# Patient Record
Sex: Female | Born: 1995 | Race: Black or African American | Hispanic: No | Marital: Single | Smoking: Never smoker
Health system: Southern US, Community
[De-identification: ages and names within clinical notes are randomized; demographics above are authoritative.]

## PROBLEM LIST (undated history)

## (undated) DIAGNOSIS — F419 Anxiety disorder, unspecified: Secondary | ICD-10-CM

## (undated) HISTORY — PX: NO PAST SURGERIES: SHX2092

---
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
## 2019-10-30 ENCOUNTER — Other Ambulatory Visit: Payer: Self-pay

## 2019-10-30 ENCOUNTER — Encounter: Payer: Self-pay | Admitting: Emergency Medicine

## 2019-10-30 ENCOUNTER — Ambulatory Visit
Admission: EM | Admit: 2019-10-30 | Discharge: 2019-10-30 | Disposition: A | Discharge status: Home / Self Care | Payer: BC Managed Care – PPO | Attending: Emergency Medicine | Admitting: Emergency Medicine

## 2019-10-30 DIAGNOSIS — T7840XA Allergy, unspecified, initial encounter: Secondary | ICD-10-CM | POA: Diagnosis not present

## 2019-10-30 HISTORY — DX: Anxiety disorder, unspecified: F41.9

## 2019-10-30 MED ORDER — FLUTICASONE PROPIONATE 50 MCG/ACT NA SUSP: 2.0000 | Freq: Every day | NASAL | 2 refills | Status: AC

## 2019-10-30 MED ORDER — LEVOCETIRIZINE DIHYDROCHLORIDE 5 MG PO TABS: 5.0000 mg | ORAL_TABLET | Freq: Every evening | ORAL | 1 refills | Status: AC

## 2019-10-30 NOTE — ED Triage Notes (Signed)
Pt c/o chest pain, shortness of breath, post nasal drainage. Started about a week ago. She states that the chest pain has been going for a "while" and has not changed. She states that she has to clear her throat a lot and she gets up yellow/green mucus. Denies cough, fever or other URI symptoms.

## 2019-10-30 NOTE — Discharge Instructions (Addendum)
You are seen today with complaints shortness of breath and postnasal drip, diagnosed with allergies.  Take your new prescribed medications as prescribed.  If your symptoms do not subside, it may be beneficial for you to follow-up with an allergist.

## 2019-10-30 NOTE — ED Provider Notes (Signed)
[facility] [place] [facility] [place]   Name: Teresa Porter DOB: 12-26-1995 MRN: [id number] CSN: [id number] PCP: System, Pcp Not In  Arrival date and time:  10/30/19 1141  Chief Complaint:  Chest Pain and Shortness of Breath   NOTE: Prior to seeing the patient today, I have reviewed the triage nursing documentation and vital signs. Clinical staff has updated patient's PMH/PSHx, current medication list, and drug allergies/intolerances to ensure comprehensive history available to assist in medical decision making.   History:   HPI: Teresa Porter is a 24 y.o. female who presents today with complaints of shortness of breath cough chest pain cough and postnasal drainage.  She states chest pain started 4 months ago and was attributed to her anxiety, however the remaining symptoms started about 1 week ago.  She states the postnasal drainage and shortness of breath worsens when she is lying flat and she feels as though she is continuously clearing her throat.  She takes OTC cetirizine for her allergies, but she has noticed little to no change in her condition.  She states she also has mild asthma and eczema.   Past Medical History:  Diagnosis Date  . Anxiety     Past Surgical History:  Procedure Laterality Date  . NO PAST SURGERIES      Family History  Problem Relation Age of Onset  . Healthy Mother   . Healthy Father     Social History   Tobacco Use  . Smoking status: Never Smoker  . Smokeless tobacco: Never Used  Substance Use Topics  . Alcohol use: Not Currently  . Drug use: Not Currently    There are no problems to display for this patient.   Home Medications:    Current Meds  Medication Sig  . escitalopram (LEXAPRO) 5 MG tablet Take by mouth.    Allergies:   Patient has no known allergies.  Review of Systems (ROS): Review of Systems  Constitutional: Negative for activity change, appetite change, diaphoresis, fatigue and fever.  HENT: Positive for  congestion, postnasal drip, sinus pain and sore throat. Negative for ear discharge, ear pain, facial swelling, hearing loss, sinus pressure and sneezing.   Eyes: Negative for pain.  Respiratory: Positive for cough. Negative for shortness of breath and wheezing.   Gastrointestinal: Negative for constipation, nausea and vomiting.  Musculoskeletal: Negative for myalgias.  All other systems reviewed and are negative.    Vital Signs: Today's Vitals   10/30/19 1216 10/30/19 1220 10/30/19 1240  BP:  132/82   Pulse:  84   Resp:  18   Temp:  98.4 F (36.9 C)   TempSrc:  Oral   SpO2:  100%   Weight: 220 lb (99.8 kg)    Height: 5\' 10"  (1.778 m)    PainSc: 0-No pain  0-No pain    Physical Exam: Physical Exam Vitals and nursing note reviewed.  Constitutional:      Appearance: Normal appearance.  HENT:     Mouth/Throat:     Pharynx: No posterior oropharyngeal erythema.  Cardiovascular:     Rate and Rhythm: Normal rate and regular rhythm.     Pulses: Normal pulses.     Heart sounds: Normal heart sounds.  Pulmonary:     Effort: Pulmonary effort is normal. No respiratory distress.     Breath sounds: Normal breath sounds.  Skin:    General: Skin is warm and dry.  Neurological:     General: No focal deficit present.  Mental Status: She is alert and oriented to person, place, and time.  Psychiatric:        Mood and Affect: Mood normal.        Behavior: Behavior normal.      Urgent Care Treatments / Results:   LABS: PLEASE NOTE: all labs that were ordered this encounter are listed, however only abnormal results are displayed. Labs Reviewed - No data to display  EKG: -None  RADIOLOGY: No results found.  PROCEDURES: Procedures  MEDICATIONS RECEIVED THIS VISIT: Medications - No data to display  PERTINENT CLINICAL COURSE NOTES/UPDATES:   Initial Impression / Assessment and Plan / Urgent Care Course:  Pertinent labs & imaging results that were available during my care  of the patient were personally reviewed by me and considered in my medical decision making (see lab/imaging section of note for values and interpretations).  Teresa Porter is a 24 y.o. female who presents to [facility] today with complaints of postnasal drainage and shortness of breath, diagnosed with allergies, and treated as such with the medications below. NP and patient reviewed discharge instructions below during visit.   Patient is well appearing overall in clinic today. She does not appear to be in any acute distress. Presenting symptoms (see HPI) and exam as documented above.   I have reviewed the follow up and strict return precautions for any new or worsening symptoms. Patient is aware of symptoms that would be deemed urgent/emergent, and would thus require further evaluation either here or in the emergency department. At the time of discharge, she verbalized understanding and consent with the discharge plan as it was reviewed with her. All questions were fielded by provider and/or clinic staff prior to patient discharge.    Final Clinical Impressions / Urgent Care Diagnoses:   Final diagnoses:  Allergy, initial encounter    New Prescriptions:  [place] Controlled Substance Registry consulted? Not Applicable  Meds ordered this encounter  Medications  . levocetirizine (XYZAL) 5 MG tablet    Sig: Take 1 tablet (5 mg total) by mouth every evening.    Dispense:  30 tablet    Refill:  1  . fluticasone (FLONASE) 50 MCG/ACT nasal spray    Sig: Place 2 sprays into both nostrils daily.    Dispense:  18.2 mL    Refill:  2      Discharge Instructions     You are seen today with complaints shortness of breath and postnasal drip, diagnosed with allergies.  Take your new prescribed medications as prescribed.  If your symptoms do not subside, it may be beneficial for you to follow-up with an allergist.    Recommended Follow up Care:  Patient encouraged to follow up with the  following provider within the specified time frame, or sooner as dictated by the severity of her symptoms. As always, she was instructed that for any urgent/emergent care needs, she should seek care either here or in the emergency department for more immediate evaluation.   Gertie Baron, DNP, NP-c    Gertie Baron, NP 10/30/19 1300

## 2020-09-04 ENCOUNTER — Ambulatory Visit
Admission: EM | Admit: 2020-09-04 | Discharge: 2020-09-04 | Disposition: A | Discharge status: Home / Self Care | Payer: BC Managed Care – PPO | Attending: Sports Medicine | Admitting: Sports Medicine

## 2020-09-04 ENCOUNTER — Other Ambulatory Visit: Payer: Self-pay

## 2020-09-04 DIAGNOSIS — Z20822 Contact with and (suspected) exposure to covid-19: Secondary | ICD-10-CM | POA: Diagnosis not present

## 2020-09-04 DIAGNOSIS — J029 Acute pharyngitis, unspecified: Secondary | ICD-10-CM | POA: Diagnosis present

## 2020-09-04 LAB — RESP PANEL BY RT-PCR (FLU A&B, COVID) ARPGX2
Influenza A by PCR: NEGATIVE
Influenza B by PCR: NEGATIVE
SARS Coronavirus 2 by RT PCR: NEGATIVE

## 2020-09-04 LAB — GROUP A STREP BY PCR: Group A Strep by PCR: NOT DETECTED

## 2020-09-04 NOTE — ED Provider Notes (Signed)
[facility]    CSN: [id number] Arrival date & time: 09/04/20  1042      History   Chief Complaint Chief Complaint  Patient presents with  . Sore Throat    HPI Teresa HOEFER is a 24 y.o. female.   24 yo female presents with 1 day of sore throat, congestion and rhinorrhea.  No fevers, shakes, chills, GI or GU symptoms.     Past Medical History:  Diagnosis Date  . Anxiety     There are no problems to display for this patient.   Past Surgical History:  Procedure Laterality Date  . NO PAST SURGERIES      OB History   No obstetric history on file.      Home Medications    Prior to Admission medications   Medication Sig Start Date End Date Taking? Authorizing Provider  fluticasone (FLONASE) 50 MCG/ACT nasal spray Place 2 sprays into both nostrils daily. 10/30/19  Yes Bailey Mech, NP  levocetirizine (XYZAL) 5 MG tablet Take 1 tablet (5 mg total) by mouth every evening. 10/30/19  Yes Bailey Mech, NP  sertraline (ZOLOFT) 25 MG tablet Take by mouth. 08/26/20 08/26/21 Yes [provider]  escitalopram (LEXAPRO) 5 MG tablet Take by mouth. 10/11/19 01/09/20  [provider]    Family History Family History  Problem Relation Age of Onset  . Healthy Mother   . Healthy Father     Social History Social History   Tobacco Use  . Smoking status: Never Smoker  . Smokeless tobacco: Never Used  Vaping Use  . Vaping Use: Never used  Substance Use Topics  . Alcohol use: Not Currently  . Drug use: Not Currently     Allergies   Patient has no known allergies.   Review of Systems Review of Systems  Constitutional: Negative for activity change, appetite change, chills and fever.  HENT: Positive for congestion, rhinorrhea, sinus pressure and sore throat. Negative for ear pain, facial swelling, trouble swallowing and voice change.   Respiratory: Negative for cough, chest tightness, shortness of breath and wheezing.    Cardiovascular: Negative for chest pain.  Gastrointestinal: Negative for abdominal pain, blood in stool, diarrhea, nausea and vomiting.  Genitourinary: Negative for difficulty urinating, dysuria and flank pain.  Musculoskeletal: Negative for arthralgias.     Physical Exam Triage Vital Signs ED Triage Vitals  Enc Vitals Group     BP 09/04/20 1059 119/71     Pulse Rate 09/04/20 1059 85     Resp 09/04/20 1059 18     Temp 09/04/20 1059 98.7 F (37.1 C)     Temp Source 09/04/20 1059 Oral     SpO2 09/04/20 1059 100 %     Weight 09/04/20 1056 220 lb (99.8 kg)     Height 09/04/20 1056 5\' 10"  (1.778 m)     Head Circumference --      Peak Flow --      Pain Score 09/04/20 1056 4     Pain Loc --      Pain Edu? --      Excl. in GC? --    No data found.  Updated Vital Signs BP 119/71 (BP Location: Right Arm)   Pulse 85   Temp 98.7 F (37.1 C) (Oral)   Resp 18   Ht 5\' 10"  (1.778 m)   Wt 99.8 kg   LMP 08/28/2020   SpO2 100%   BMI 31.57 kg/m   Visual Acuity Right Eye Distance:  Left Eye Distance:   Bilateral Distance:    Right Eye Near:   Left Eye Near:    Bilateral Near:     Physical Exam Vitals reviewed.  Constitutional:      Appearance: She is well-developed.  HENT:     Head: Normocephalic.     Right Ear: Tympanic membrane normal.     Left Ear: Tympanic membrane normal.     Nose: Congestion present.     Mouth/Throat:     Mouth: Mucous membranes are moist.     Pharynx: Oropharynx is clear. Uvula midline.     Tonsils: No tonsillar exudate or tonsillar abscesses. 1+ on the right. 1+ on the left.  Eyes:     Conjunctiva/sclera: Conjunctivae normal.  Cardiovascular:     Rate and Rhythm: Regular rhythm.     Heart sounds: Normal heart sounds. No murmur heard. No friction rub. No gallop.   Pulmonary:     Effort: Pulmonary effort is normal. No respiratory distress.     Breath sounds: Normal breath sounds. No stridor. No wheezing, rhonchi or rales.  Musculoskeletal:      Cervical back: Normal range of motion and neck supple.  Lymphadenopathy:     Cervical: No cervical adenopathy.  Skin:    General: Skin is warm and dry.     Capillary Refill: Capillary refill takes less than 2 seconds.  Neurological:     Mental Status: She is alert.      UC Treatments / Results  Labs (all labs ordered are listed, but only abnormal results are displayed) Labs Reviewed  RESP PANEL BY RT-PCR (FLU A&B, COVID) ARPGX2  GROUP A STREP BY PCR    EKG   Radiology No results found.  Procedures Procedures (including critical care time)  Medications Ordered in UC Medications - No data to display  Initial Impression / Assessment and Plan / UC Course  I have reviewed the triage vital signs and the nursing notes.  Pertinent labs & imaging results that were available during my care of the patient were reviewed by me and considered in my medical decision making (see chart for details).     Labs were negative today.  Follow up here, with PCP, or ER if symptoms persist or worsen. Final Clinical Impressions(s) / UC Diagnoses   Final diagnoses:  None   Discharge Instructions   None    ED Prescriptions    None     PDMP not reviewed this encounter.   Delton See, MD 09/04/20 1204

## 2020-09-04 NOTE — Discharge Instructions (Addendum)
Labs were negative today.  Follow up here, with PCP, or ER if symptoms persist or worsen.

## 2020-09-04 NOTE — ED Triage Notes (Signed)
Patient states that she has been having a sore throat and nasal congestion x yesterday. Patient states that she has been taking Theraflu without relief.

## 2022-01-05 ENCOUNTER — Ambulatory Visit
Admission: EM | Admit: 2022-01-05 | Discharge: 2022-01-05 | Disposition: A | Discharge status: Home / Self Care | Payer: 59 | Attending: Physician Assistant | Admitting: Physician Assistant

## 2022-01-05 DIAGNOSIS — J309 Allergic rhinitis, unspecified: Secondary | ICD-10-CM | POA: Insufficient documentation

## 2022-01-05 DIAGNOSIS — J029 Acute pharyngitis, unspecified: Secondary | ICD-10-CM | POA: Insufficient documentation

## 2022-01-05 LAB — GROUP A STREP BY PCR: Group A Strep by PCR: NOT DETECTED

## 2022-01-05 MED ORDER — LIDOCAINE VISCOUS HCL 2 % MT SOLN: 15.0000 mL | OROMUCOSAL | 0 refills | Status: AC | PRN

## 2022-01-05 NOTE — Discharge Instructions (Signed)
-  Strep test negative.  Continue allergy medication.  Make sure you are taking an antihistamine if you are not already.  Consider taking Allegra-D which you get behind the counter.  Can also use nasal saline and make sure to increase rest and fluids.  I have sent viscous lidocaine to pharmacy. ?

## 2022-01-05 NOTE — ED Provider Notes (Signed)
?[facility] ? ? ? ?CSN: [id number] ?Arrival date & time: 01/05/22  1818 ? ? ?  ? ?History   ?Chief Complaint ?Chief Complaint  ?Patient presents with  ? Sore Throat  ? ? ?HPI ?Teresa Porter is a 26 y.o. female presenting for 1 week history of sore throat, painful swallowing, cough, congestion and postnasal drainage.  Denies fever, fatigue, with difficulty, nausea/vomiting/diarrhea.  Denies sick contacts.  History of allergies.  Takes Singulair and uses Flonase.  Patient is also been taking Mucinex and says it helps a little but the sore throat seems to be a little worse now and she wants to be checked for strep throat.  She is otherwise healthy.  No other complaints. ? ?HPI ? ?Past Medical History:  ?Diagnosis Date  ? Anxiety   ? ? ?There are no problems to display for this patient. ? ? ?Past Surgical History:  ?Procedure Laterality Date  ? NO PAST SURGERIES    ? ? ?OB History   ?No obstetric history on file. ?  ? ? ? ?Home Medications   ? ?Prior to Admission medications   ?Medication Sig Start Date End Date Taking? Authorizing Provider  ?fluticasone (FLONASE) 50 MCG/ACT nasal spray Place 2 sprays into both nostrils daily. 10/30/19  Yes Bailey Mech, NP  ?levocetirizine (XYZAL) 5 MG tablet Take 1 tablet (5 mg total) by mouth every evening. 10/30/19  Yes Bailey Mech, NP  ?lidocaine (XYLOCAINE) 2 % solution Use as directed 15 mLs in the mouth or throat every 3 (three) hours as needed for mouth pain (swish and spit). 01/05/22  Yes Eusebio Friendly B, PA-C  ?escitalopram (LEXAPRO) 5 MG tablet Take by mouth. 10/11/19 01/09/20  [provider]  ?sertraline (ZOLOFT) 25 MG tablet Take by mouth. 08/26/20 08/26/21  [provider]  ? ? ?Family History ?Family History  ?Problem Relation Age of Onset  ? Healthy Mother   ? Healthy Father   ? ? ?Social History ?Social History  ? ?Tobacco Use  ? Smoking status: Never  ? Smokeless tobacco: Never  ?Vaping Use  ? Vaping Use: Never used  ?Substance Use  Topics  ? Alcohol use: Not Currently  ? Drug use: Not Currently  ? ? ? ?Allergies   ?Patient has no known allergies. ? ? ?Review of Systems ?Review of Systems  ?Constitutional:  Negative for chills, diaphoresis, fatigue and fever.  ?HENT:  Positive for congestion, postnasal drip, rhinorrhea and sore throat. Negative for ear pain, sinus pressure and sinus pain.   ?Respiratory:  Positive for cough. Negative for shortness of breath.   ?Gastrointestinal:  Negative for abdominal pain, nausea and vomiting.  ?Musculoskeletal:  Negative for arthralgias and myalgias.  ?Skin:  Negative for rash.  ?Neurological:  Negative for weakness and headaches.  ?Hematological:  Negative for adenopathy.  ? ? ?Physical Exam ?Triage Vital Signs ?ED Triage Vitals  ?Enc Vitals Group  ?   BP 01/05/22 1842 139/81  ?   Pulse Rate 01/05/22 1842 76  ?   Resp 01/05/22 1842 18  ?   Temp 01/05/22 1842 98.8 ?F (37.1 ?C)  ?   Temp Source 01/05/22 1842 Oral  ?   SpO2 01/05/22 1842 100 %  ?   Weight 01/05/22 1841 220 lb (99.8 kg)  ?   Height 01/05/22 1841 5\' 10"  (1.778 m)  ?   Head Circumference --   ?   Peak Flow --   ?   Pain Score 01/05/22 1841 7  ?  Pain Loc --   ?   Pain Edu? --   ?   Excl. in GC? --   ? ?No data found. ? ?Updated Vital Signs ?BP 139/81 (BP Location: Left Arm)   Pulse 76   Temp 98.8 ?F (37.1 ?C) (Oral)   Resp 18   Ht 5\' 10"  (1.778 m)   Wt 220 lb (99.8 kg)   LMP 01/05/2022   SpO2 100%   BMI 31.57 kg/m?  ?  ? ?Physical Exam ?Vitals and nursing note reviewed.  ?Constitutional:   ?   General: She is not in acute distress. ?   Appearance: Normal appearance. She is not ill-appearing or toxic-appearing.  ?HENT:  ?   Head: Normocephalic and atraumatic.  ?   Right Ear: Tympanic membrane, ear canal and external ear normal.  ?   Left Ear: Tympanic membrane, ear canal and external ear normal.  ?   Nose: Congestion present.  ?   Mouth/Throat:  ?   Mouth: Mucous membranes are moist.  ?   Pharynx: Oropharynx is clear. Posterior  oropharyngeal erythema present.  ?Eyes:  ?   General: No scleral icterus.    ?   Right eye: No discharge.     ?   Left eye: No discharge.  ?   Conjunctiva/sclera: Conjunctivae normal.  ?Cardiovascular:  ?   Rate and Rhythm: Normal rate and regular rhythm.  ?   Heart sounds: Normal heart sounds.  ?Pulmonary:  ?   Effort: Pulmonary effort is normal. No respiratory distress.  ?   Breath sounds: Normal breath sounds.  ?Musculoskeletal:  ?   Cervical back: Neck supple.  ?Lymphadenopathy:  ?   Cervical: No cervical adenopathy.  ?Skin: ?   General: Skin is dry.  ?Neurological:  ?   General: No focal deficit present.  ?   Mental Status: She is alert. Mental status is at baseline.  ?   Motor: No weakness.  ?   Gait: Gait normal.  ?Psychiatric:     ?   Mood and Affect: Mood normal.     ?   Behavior: Behavior normal.     ?   Thought Content: Thought content normal.  ? ? ? ?UC Treatments / Results  ?Labs ?(all labs ordered are listed, but only abnormal results are displayed) ?Labs Reviewed  ?GROUP A STREP BY PCR  ? ? ?EKG ? ? ?Radiology ?No results found. ? ?Procedures ?Procedures (including critical care time) ? ?Medications Ordered in UC ?Medications - No data to display ? ?Initial Impression / Assessment and Plan / UC Course  ?I have reviewed the triage vital signs and the nursing notes. ? ?Pertinent labs & imaging results that were available during my care of the patient were reviewed by me and considered in my medical decision making (see chart for details). ? ?26 year old female presenting for sore throat, painful swallowing, postnasal drainage, nasal congestion/runny nose and cough for the past 1 week.  Sore throat seems to be getting little worse despite taking Singulair, Flonase and Mucinex.  Vitals normal and stable and patient overall well-appearing.  On exam she does have nasal congestion without drainage, erythema posterior pharynx with clearish postnasal drainage.  Chest clear to auscultation heart regular rate  and rhythm.  PCR strep test performed. ? ?Negative strep.  Symptoms likely due to allergies and patient's not taking antihistamine.  Advised her to start Allegra-D and continue with the Singulair and Flonase.  Sent viscous lidocaine.  Advised throat lozenges and Chloraseptic  spray as well.  Increase rest and fluids and follow-up as needed. ? ?Final Clinical Impressions(s) / UC Diagnoses  ? ?Final diagnoses:  ?Sore throat  ?Allergic rhinitis, unspecified seasonality, unspecified trigger  ? ? ? ?Discharge Instructions   ? ?  ?-Strep test negative.  Continue allergy medication.  Make sure you are taking an antihistamine if you are not already.  Consider taking Allegra-D which you get behind the counter.  Can also use nasal saline and make sure to increase rest and fluids.  I have sent viscous lidocaine to pharmacy. ? ? ? ? ?ED Prescriptions   ? ? Medication Sig Dispense Auth. Provider  ? lidocaine (XYLOCAINE) 2 % solution Use as directed 15 mLs in the mouth or throat every 3 (three) hours as needed for mouth pain (swish and spit). 100 mL Shirlee LatchEaves, Marca Gadsby B, PA-C  ? ?  ? ?PDMP not reviewed this encounter. ?  ?Shirlee Latchaves, Evangelyne Loja B, PA-C ?01/05/22 1920 ? ?

## 2022-01-05 NOTE — ED Triage Notes (Signed)
Pt c/o possible strep throat. Pt states that it hurts to swallow and has been sore for about a week.  ? ?Pt asks for a strep test to be done.  ?

## 2022-09-29 ENCOUNTER — Ambulatory Visit
Admission: EM | Admit: 2022-09-29 | Discharge: 2022-09-29 | Disposition: A | Discharge status: Home / Self Care | Payer: 59

## 2022-09-29 ENCOUNTER — Other Ambulatory Visit: Payer: Self-pay

## 2022-09-29 DIAGNOSIS — J069 Acute upper respiratory infection, unspecified: Secondary | ICD-10-CM | POA: Diagnosis not present

## 2022-09-29 MED ORDER — IPRATROPIUM BROMIDE 0.06 % NA SOLN: 2.0000 | Freq: Four times a day (QID) | NASAL | 12 refills | Status: AC

## 2022-09-29 MED ORDER — BENZONATATE 100 MG PO CAPS: 200.0000 mg | ORAL_CAPSULE | Freq: Three times a day (TID) | ORAL | 0 refills | Status: AC

## 2022-09-29 MED ORDER — PROMETHAZINE-DM 6.25-15 MG/5ML PO SYRP: 5.0000 mL | ORAL_SOLUTION | Freq: Four times a day (QID) | ORAL | 0 refills | Status: AC | PRN

## 2022-09-29 NOTE — ED Provider Notes (Signed)
[facility]    CSN: [id number] Arrival date & time: 09/29/22  1306      History   Chief Complaint Chief Complaint  Patient presents with   Generalized Body Aches   Nasal Congestion   Sore Throat    HPI Teresa Porter is a 27 y.o. female.   HPI  27 year old female here for evaluation of respiratory complaints.  The patient reports that her symptoms began 4 days ago and they consist of a subjective fever, runny nose, nasal congestion, sneezing, ear pain, sore throat, cough that is productive for yellow sputum, and body aches.  She denies any shortness of breath, wheezing, GI complaints, or recent travel.  She states that her mom had influenza over Christmas.  Past Medical History:  Diagnosis Date   Anxiety     There are no problems to display for this patient.   Past Surgical History:  Procedure Laterality Date   NO PAST SURGERIES      OB History   No obstetric history on file.      Home Medications    Prior to Admission medications   Medication Sig Start Date End Date Taking? Authorizing Provider  albuterol (VENTOLIN HFA) 108 (90 Base) MCG/ACT inhaler Inhale 2 puffs into the lungs every 6 (six) hours as needed. 07/22/19  Yes [provider]  benzonatate (TESSALON) 100 MG capsule Take 2 capsules (200 mg total) by mouth every 8 (eight) hours. 09/29/22  Yes Margarette Canada, NP  EPINEPHrine 0.3 mg/0.3 mL IJ SOAJ injection SMARTSIG:1 Pre-Filled Pen Syringe IM As Directed 07/31/22  Yes [provider]  ipratropium (ATROVENT) 0.06 % nasal spray Place 2 sprays into both nostrils 4 (four) times daily. 09/29/22  Yes Margarette Canada, NP  montelukast (SINGULAIR) 10 MG tablet Take 1 tablet by mouth at bedtime. 06/25/22 06/25/23 Yes [provider]  promethazine-dextromethorphan (PROMETHAZINE-DM) 6.25-15 MG/5ML syrup Take 5 mLs by mouth 4 (four) times daily as needed. 09/29/22  Yes Margarette Canada, NP  sertraline (ZOLOFT) 50 MG tablet Take by mouth.  06/25/22  Yes [provider]  SUMAtriptan (IMITREX) 100 MG tablet Take by mouth. 03/04/22 03/04/23 Yes [provider]  topiramate (TOPAMAX) 25 MG tablet Take 1 tablet by mouth at bedtime. 01/22/22 01/22/23 Yes [provider]  escitalopram (LEXAPRO) 5 MG tablet Take by mouth. 10/11/19 01/09/20  [provider]  fluticasone (FLONASE) 50 MCG/ACT nasal spray Place 2 sprays into both nostrils daily. 10/30/19   Gertie Baron, NP  levocetirizine (XYZAL) 5 MG tablet Take 1 tablet (5 mg total) by mouth every evening. 10/30/19   Gertie Baron, NP  lidocaine (XYLOCAINE) 2 % solution Use as directed 15 mLs in the mouth or throat every 3 (three) hours as needed for mouth pain (swish and spit). 01/05/22   Laurene Footman B, PA-C  sertraline (ZOLOFT) 25 MG tablet Take by mouth. 08/26/20 08/26/21  [provider]    Family History Family History  Problem Relation Age of Onset   Healthy Mother    Healthy Father     Social History Social History   Tobacco Use   Smoking status: Never   Smokeless tobacco: Never  Vaping Use   Vaping Use: Never used  Substance Use Topics   Alcohol use: Not Currently   Drug use: Not Currently     Allergies   Patient has no known allergies.   Review of Systems Review of Systems  Constitutional:  Positive for fever.  HENT:  Positive for congestion, ear  pain, rhinorrhea, sneezing and sore throat.   Respiratory:  Positive for cough. Negative for shortness of breath and wheezing.   Gastrointestinal:  Negative for diarrhea, nausea and vomiting.  Skin:  Negative for rash.  Hematological: Negative.   Psychiatric/Behavioral: Negative.       Physical Exam Triage Vital Signs ED Triage Vitals  Enc Vitals Group     BP 09/29/22 1415 116/81     Pulse Rate 09/29/22 1415 89     Resp 09/29/22 1415 18     Temp 09/29/22 1415 99 F (37.2 C)     Temp Source 09/29/22 1415 Oral     SpO2 09/29/22 1415 98 %     Weight 09/29/22 1413 240  lb (108.9 kg)     Height 09/29/22 1413 5\' 10"  (1.778 m)     Head Circumference --      Peak Flow --      Pain Score 09/29/22 1407 3     Pain Loc --      Pain Edu? --      Excl. in [place]? --    No data found.  Updated Vital Signs BP 116/81 (BP Location: Left Arm)   Pulse 89   Temp 99 F (37.2 C) (Oral)   Resp 18   Ht 5\' 10"  (1.778 m)   Wt 240 lb (108.9 kg)   LMP 09/14/2022   SpO2 98%   BMI 34.44 kg/m   Visual Acuity Right Eye Distance:   Left Eye Distance:   Bilateral Distance:    Right Eye Near:   Left Eye Near:    Bilateral Near:     Physical Exam Vitals and nursing note reviewed.  Constitutional:      Appearance: Normal appearance. She is not ill-appearing.  HENT:     Head: Normocephalic and atraumatic.     Right Ear: Tympanic membrane, ear canal and external ear normal. There is no impacted cerumen.     Left Ear: Tympanic membrane, ear canal and external ear normal. There is no impacted cerumen.     Nose: Congestion and rhinorrhea present.     Comments: His mucosa is erythematous and edematous with scant clear discharge in both nares.    Mouth/Throat:     Mouth: Mucous membranes are moist.     Pharynx: Oropharynx is clear. Posterior oropharyngeal erythema present. No oropharyngeal exudate.     Comments: Posterior pharynx reveals erythema injection with clear postnasal drip.  Tonsillar pillars are unremarkable. Cardiovascular:     Rate and Rhythm: Normal rate and regular rhythm.     Pulses: Normal pulses.     Heart sounds: Normal heart sounds. No murmur heard.    No friction rub. No gallop.  Pulmonary:     Effort: Pulmonary effort is normal.     Breath sounds: Normal breath sounds. No wheezing, rhonchi or rales.  Musculoskeletal:     Cervical back: Normal range of motion and neck supple.  Lymphadenopathy:     Cervical: No cervical adenopathy.  Skin:    General: Skin is warm and dry.     Capillary Refill: Capillary refill takes less than 2 seconds.      Findings: No erythema or rash.  Neurological:     General: No focal deficit present.     Mental Status: She is alert and oriented to person, place, and time.  Psychiatric:        Mood and Affect: Mood normal.        Behavior: Behavior normal.  Thought Content: Thought content normal.        Judgment: Judgment normal.      UC Treatments / Results  Labs (all labs ordered are listed, but only abnormal results are displayed) Labs Reviewed - No data to display  EKG   Radiology No results found.  Procedures Procedures (including critical care time)  Medications Ordered in UC Medications - No data to display  Initial Impression / Assessment and Plan / UC Course  I have reviewed the triage vital signs and the nursing notes.  Pertinent labs & imaging results that were available during my care of the patient were reviewed by me and considered in my medical decision making (see chart for details).   Patient is a nontoxic-appearing 27 year old female here for evaluation of respiratory complaints as outlined HPI above.  She was around her mom who had influenza at present time.  She does have inflammation of her upper respiratory tract as evidenced by inflamed nasal mucosa with clear nasal discharge and posterior oropharyngeal erythema injection with clear postnasal drip.  No cervical adenopathy present on exam.  Cardiopulmonary exam bisque lung sounds in all fields.  Patient exam is consistent with an upper respiratory infection and is most likely viral.  She is on day 4 symptoms so she is outside the treatment window for Tamiflu so I will not test her for flu.  She also has no contributing comorbidities and does not qualify for antiviral therapy for COVID so I will not test her for COVID.  I have advised her to quarantine for an additional day to meet the 5-day quarantine duration in case it is COVID.  I have given her a work note to cover her for that timeframe.  She can use  over-the-counter Tylenol and ibuprofen to help with fever and body aches.  I will prescribe Atrovent nasal spray to open the nasal congestion and Tessalon Perles and Promethazine DM cough syrup to help with cough and congestion.  ER and return precautions reviewed.   Final Clinical Impressions(s) / UC Diagnoses   Final diagnoses:  Viral URI with cough     Discharge Instructions      You have a viral respiratory infection.  Use OTC Tylenol and Ibuprofen to help with body aches or fever.  Use the Atrovent nasal spray, 2 squirts in each nostril every 6 hours, as needed for runny nose and postnasal drip.  Use the Tessalon Perles every 8 hours during the day.  Take them with a small sip of water.  They may give you some numbness to the base of your tongue or a metallic taste in your mouth, this is normal.  Use the Promethazine DM cough syrup at bedtime for cough and congestion.  It will make you drowsy so do not take it during the day.  Return for reevaluation or see your primary care provider for any new or worsening symptoms.    ED Prescriptions     Medication Sig Dispense Auth. Provider   benzonatate (TESSALON) 100 MG capsule Take 2 capsules (200 mg total) by mouth every 8 (eight) hours. 21 capsule Becky Augusta, NP   ipratropium (ATROVENT) 0.06 % nasal spray Place 2 sprays into both nostrils 4 (four) times daily. 15 mL Becky Augusta, NP   promethazine-dextromethorphan (PROMETHAZINE-DM) 6.25-15 MG/5ML syrup Take 5 mLs by mouth 4 (four) times daily as needed. 118 mL Becky Augusta, NP      PDMP not reviewed this encounter.   Becky Augusta, NP 09/29/22 1453

## 2022-09-29 NOTE — ED Triage Notes (Signed)
Pt with sneezing, coughing, subjective fever, generalized bodyaches. Two negative COVID tests.

## 2022-09-29 NOTE — Discharge Instructions (Signed)
You have a viral respiratory infection.  Use OTC Tylenol and Ibuprofen to help with body aches or fever.  Use the Atrovent nasal spray, 2 squirts in each nostril every 6 hours, as needed for runny nose and postnasal drip.  Use the Tessalon Perles every 8 hours during the day.  Take them with a small sip of water.  They may give you some numbness to the base of your tongue or a metallic taste in your mouth, this is normal.  Use the Promethazine DM cough syrup at bedtime for cough and congestion.  It will make you drowsy so do not take it during the day.  Return for reevaluation or see your primary care provider for any new or worsening symptoms.
